# Patient Record
Sex: Female | Born: 1980 | Race: White | Hispanic: No | Marital: Married | Smoking: Never smoker
Health system: Southern US, Community
[De-identification: ages and names within clinical notes are randomized; demographics above are authoritative.]

---

## 2002-06-25 ENCOUNTER — Other Ambulatory Visit: Admission: RE | Admit: 2002-06-25 | Discharge: 2002-06-25 | Payer: Self-pay | Admitting: Gynecology

## 2004-02-15 ENCOUNTER — Other Ambulatory Visit: Admission: RE | Admit: 2004-02-15 | Discharge: 2004-02-15 | Payer: Self-pay | Admitting: Gynecology

## 2005-02-25 ENCOUNTER — Other Ambulatory Visit: Admission: RE | Admit: 2005-02-25 | Discharge: 2005-02-25 | Payer: Self-pay | Admitting: Gynecology

## 2006-02-27 ENCOUNTER — Other Ambulatory Visit: Admission: RE | Admit: 2006-02-27 | Discharge: 2006-02-27 | Payer: Self-pay | Admitting: Gynecology

## 2007-02-02 ENCOUNTER — Ambulatory Visit (HOSPITAL_COMMUNITY): Admission: RE | Admit: 2007-02-02 | Discharge: 2007-02-02 | Payer: Self-pay | Admitting: Family Medicine

## 2010-05-08 ENCOUNTER — Inpatient Hospital Stay (HOSPITAL_COMMUNITY): Admission: RE | Admit: 2010-05-08 | Payer: Self-pay | Source: Ambulatory Visit | Admitting: Obstetrics and Gynecology

## 2010-05-08 ENCOUNTER — Inpatient Hospital Stay (HOSPITAL_COMMUNITY)
Admission: RE | Admit: 2010-05-08 | Discharge: 2010-05-11 | DRG: 373 | Disposition: A | Discharge status: Home / Self Care | Payer: BC Managed Care – PPO | Source: Ambulatory Visit | Attending: Obstetrics and Gynecology | Admitting: Obstetrics and Gynecology

## 2010-05-08 DIAGNOSIS — O99892 Other specified diseases and conditions complicating childbirth: Secondary | ICD-10-CM | POA: Diagnosis present

## 2010-05-08 DIAGNOSIS — Z22330 Carrier of Group B streptococcus: Secondary | ICD-10-CM

## 2010-05-08 LAB — CBC
HCT: 41.7 % (ref 36.0–46.0)
MCH: 31.5 pg (ref 26.0–34.0)
MCV: 93.1 fL (ref 78.0–100.0)
RBC: 4.48 MIL/uL (ref 3.87–5.11)
WBC: 8.6 10*3/uL (ref 4.0–10.5)

## 2010-05-14 NOTE — Discharge Summary (Signed)
  Debra Roberson, Debra Roberson           ACCOUNT NO.:  [id number]  MEDICAL RECORD NO.:  [id number]           PATIENT TYPE:  I  LOCATION:  9145                          FACILITY:  [facility]  PHYSICIAN:  Zenaida Niece, M.D.DATE OF BIRTH:  Sep 29, 1980  DATE OF ADMISSION:  05/08/2010 DATE OF DISCHARGE:  05/11/2010                              DISCHARGE SUMMARY   ADMISSION DIAGNOSIS:  Intrauterine pregnancy at 40 weeks and group B strep carrier.  DISCHARGE DIAGNOSES:  Intrauterine pregnancy at 40 weeks and group B strep carrier.  PROCEDURES:  On April 25, she had a spontaneous vaginal delivery.  HISTORY AND PHYSICAL:  This is a 30 year old gravida 1, para 0 with an EGA of 40 plus weeks who is admitted for elective induction.  Prenatal care uncomplicated.  PRENATAL LABS:  Blood type is A+ with a negative antibody screen, rubella immune, first trimester screen normal.  MSAFP normal.  GCT 159. GTT normal.  Group B strep is positive.  PAST HISTORY:  Tonsillectomy and wisdom tooth extraction.  ALLERGIES:  VICODIN which causes nausea, vomiting.  PHYSICAL EXAMINATION:  VITAL SIGNS:  She is afebrile with stable vital signs.  Fetal heart tracing category 1. ABDOMEN:  Gravid, nontender with an estimated fetal weight of 8 pounds. Cervix 1-2, 60, -2 vertex presentation.  HOSPITAL COURSE:  The patient was started on Pitocin for induction and penicillin for group B strep prophylaxis.  She progressed to 2 and 3 cm and membranes were ruptured.  She then gradually progressed in active labor, reached complete, pushed well, and early on the morning of April 25 had a vaginal delivery of a viable female infant with Apgar's of 6 and 8, weight 8 pounds 5 ounces and had a nuchal cord x1.  Placenta delivered spontaneous was intact.  She had an irregular second-degree laceration repaired with 2-0 and 3-0 Vicryl with local block.  Estimated blood loss was 500 mL.  She had no pain medication during  labor. Postpartum, she had no significant complications and on postpartum day #2 was stable for discharge home.  DISCHARGE INSTRUCTIONS:  Regular diet, pelvic rest.  Followup is in 6 weeks.  MEDICATIONS:  Percocet #20, 1-2 p.o. q.4-6 h. p.r.n. pain and over-the- counter ibuprofen as needed and she is given our discharge pamphlet.     Zenaida Niece, M.D.     TDM/MEDQ  D:  05/11/2010  T:  05/11/2010  Job:  [id number]  Electronically Signed by Lavina Hamman M.D. on 05/14/2010 08:37:44 AM

## 2014-12-26 ENCOUNTER — Other Ambulatory Visit: Payer: BC Managed Care – PPO

## 2014-12-26 ENCOUNTER — Other Ambulatory Visit: Payer: Self-pay | Admitting: Internal Medicine

## 2014-12-26 DIAGNOSIS — R1011 Right upper quadrant pain: Secondary | ICD-10-CM

## 2015-05-11 ENCOUNTER — Other Ambulatory Visit: Payer: Self-pay | Admitting: Family Medicine

## 2015-05-11 DIAGNOSIS — M898X8 Other specified disorders of bone, other site: Secondary | ICD-10-CM

## 2015-05-26 ENCOUNTER — Ambulatory Visit
Admission: RE | Admit: 2015-05-26 | Discharge: 2015-05-26 | Disposition: A | Discharge status: Home / Self Care | Payer: BC Managed Care – PPO | Source: Ambulatory Visit | Attending: Family Medicine | Admitting: Family Medicine

## 2015-05-26 DIAGNOSIS — M898X8 Other specified disorders of bone, other site: Secondary | ICD-10-CM

## 2016-02-26 ENCOUNTER — Other Ambulatory Visit: Payer: Self-pay | Admitting: Family Medicine

## 2016-02-26 DIAGNOSIS — R1013 Epigastric pain: Secondary | ICD-10-CM

## 2016-02-28 ENCOUNTER — Ambulatory Visit
Admission: RE | Admit: 2016-02-28 | Discharge: 2016-02-28 | Disposition: A | Discharge status: Home / Self Care | Payer: BC Managed Care – PPO | Source: Ambulatory Visit | Attending: Family Medicine | Admitting: Family Medicine

## 2016-02-28 DIAGNOSIS — R1013 Epigastric pain: Secondary | ICD-10-CM

## 2017-07-12 ENCOUNTER — Ambulatory Visit (INDEPENDENT_AMBULATORY_CARE_PROVIDER_SITE_OTHER): Payer: BC Managed Care – PPO

## 2017-07-12 ENCOUNTER — Ambulatory Visit: Payer: BC Managed Care – PPO | Admitting: Podiatry

## 2017-07-12 DIAGNOSIS — M84374A Stress fracture, right foot, initial encounter for fracture: Secondary | ICD-10-CM

## 2017-07-12 DIAGNOSIS — M779 Enthesopathy, unspecified: Secondary | ICD-10-CM | POA: Diagnosis not present

## 2017-07-12 MED ORDER — MELOXICAM 7.5 MG PO TABS: 7.5000 mg | ORAL_TABLET | Freq: Every day | ORAL | 0 refills | Status: AC

## 2017-07-12 NOTE — Progress Notes (Signed)
Subjective:   Patient ID: Debra Roberson, female   DOB: 37 y.o.   MRN: [id number]   HPI 37 year old female presents the office today for concerns of pain to the ball of her right foot.  She states that started a couple months ago more on top of her foot but over the last month has been getting worse and over the past week she has not been able to put weight on her foot without any sharp pain.  She has no recent treatment for it.  She has had some minimal swelling but no numbness or tingling to the toes.  She has no other concerns today.   Review of Systems  All other systems reviewed and are negative.  No past medical history on file.    No current outpatient medications on file.  Allergies not on file       Objective:  Physical Exam  General: AAO x3, NAD  Dermatological: Skin is warm, dry and supple bilateral. Nails x 10 are well manicured; remaining integument appears unremarkable at this time. There are no open sores, no preulcerative lesions, no rash or signs of infection present.  Vascular: Dorsalis Pedis artery and Posterior Tibial artery pedal pulses are 2/4 bilateral with immedate capillary fill time.  There is no pain with calf compression, swelling, warmth, erythema.   Neruologic: Grossly intact via light touch bilateral. Protective threshold with Semmes Wienstein monofilament intact to all pedal sites bilateral.   Musculoskeletal: There is tenderness directly along the second metatarsal distally is in the left third foot third metatarsal.  There is trace edema there is no erythema or increase in warmth.  Is no pain with MPJ.  There is no pain in the interspaces nodes no palpable neuroma identified.  No other areas of tenderness identified today.  Muscular strength 5/5 in all groups tested bilateral.  Gait: Unassisted, Nonantalgic.       Assessment:   Left second metatarsal stress fracture    Plan:  -Treatment options discussed including all alternatives,  risks, and complications -Etiology of symptoms were discussed -X-rays were obtained and reviewed with the patient.  The oblique view concern for radiolucency in the neck of the second metatarsal concerning for stress fracture.  Also given this is where she has majority tenderness when immobilized in a cam boot.  Prescribed meloxicam to take as needed.  Continue ice elevation.  RTC 2 weeks or sooner if needed.  X-ray next appointment  Vivi BarrackMatthew R Wagoner DPM

## 2017-07-25 ENCOUNTER — Ambulatory Visit (INDEPENDENT_AMBULATORY_CARE_PROVIDER_SITE_OTHER): Payer: BC Managed Care – PPO

## 2017-07-25 ENCOUNTER — Ambulatory Visit: Payer: BC Managed Care – PPO | Admitting: Podiatry

## 2017-07-25 DIAGNOSIS — M84374A Stress fracture, right foot, initial encounter for fracture: Secondary | ICD-10-CM

## 2017-07-25 DIAGNOSIS — M84374D Stress fracture, right foot, subsequent encounter for fracture with routine healing: Secondary | ICD-10-CM | POA: Diagnosis not present

## 2017-07-28 NOTE — Progress Notes (Signed)
Subjective: 37 year old female presents the office today for follow-up evaluation of a stress fracture to the right foot.  She states that since wearing the cam boot her pain has improved but she has not gone without it.  She has started to notice some tingling to the toe the last couple days but denies any recent injury or increase in swelling.  She has no other new concerns. Denies any systemic complaints such as fevers, chills, nausea, vomiting. No acute changes since last appointment, and no other complaints at this time.   Objective: AAO x3, NAD DP/PT pulses palpable bilaterally, CRT less than 3 seconds At this time there is significantly improved tenderness on the second metatarsal and there is no pain to vibratory sensation today.  There is no significant swelling there is no erythema or increase in warmth.  There is no other areas of pinpoint tenderness.  No open lesions or pre-ulcerative lesions.  No pain with calf compression, swelling, warmth, erythema  Assessment: Left foot stress fracture with improvement  Plan: -All treatment options discussed with the patient including all alternatives, risks, complications.  -Repeat x-rays did reveal reveal some fine in the neck of the second metatarsal most seen on the oblique view.  She had most of her tenderness previously and this is improving.  I want her to continue the cam boot for the next 2 weeks initially can slowly start to transition out of the boot as she is able to.  Continue ice elevate and limit activity. -Patient encouraged to call the office with any questions, concerns, change in symptoms.   RTC 2-3 weeks or sooner if needed. X-ray right foot next appointment  Vivi BarrackMatthew R Wagoner DPM

## 2017-08-14 ENCOUNTER — Ambulatory Visit: Payer: BC Managed Care – PPO | Admitting: Podiatry

## 2017-08-14 ENCOUNTER — Encounter: Payer: Self-pay | Admitting: Podiatry

## 2017-08-14 ENCOUNTER — Ambulatory Visit (INDEPENDENT_AMBULATORY_CARE_PROVIDER_SITE_OTHER): Payer: BC Managed Care – PPO

## 2017-08-14 DIAGNOSIS — M84374D Stress fracture, right foot, subsequent encounter for fracture with routine healing: Secondary | ICD-10-CM | POA: Diagnosis not present

## 2017-08-14 DIAGNOSIS — M779 Enthesopathy, unspecified: Secondary | ICD-10-CM

## 2017-08-14 MED ORDER — MELOXICAM 15 MG PO TABS: 15.0000 mg | ORAL_TABLET | Freq: Every day | ORAL | 0 refills | Status: DC

## 2017-08-18 NOTE — Progress Notes (Signed)
Subjective: 37 year old female presents the office today for follow-up evaluation of a stress fracture to the right foot.  Overall she states that she is doing better.  She is wearing a regular shoe.  She states that she is still nervous but full weight on her foot.  She denies any recent injury or trauma since I last saw her.  She has no other changes.  Objective: AAO x3, NAD-presents today wearing [place] top ciders. DP/PT pulses palpable bilaterally, CRT less than 3 seconds This time is only tenderness palpation mostly to the plantar aspect of the right foot on the submetatarsal 2 area.  There is minimal edema plantarly compared to the contralateral extremity.  Overall the tenderness has improved.  There is no area pinpoint tenderness identified today. No open lesions or pre-ulcerative lesions.  No pain with calf compression, swelling, warmth, erythema  Assessment: Left foot stress fracture with improvement; capsulitis  Plan: -All treatment options discussed with the patient including all alternatives, risks, complications.  -Repeat x-rays were performed today.  There is some swelling present to the second metatarsal head but there is no definitive evidence of acute fracture identified. -Prescribed meloxicam discussed side effects.  Offloading pads were dispensed.  If symptoms continue or do a steroid injection into the area for now we will hold off because she is still improving.  Follow-up as scheduled if symptoms continue or sooner if there is any issues that arise.  She agrees with this plan.  Vivi BarrackMatthew R Wagoner DPM

## 2017-09-10 ENCOUNTER — Other Ambulatory Visit: Payer: Self-pay | Admitting: Podiatry

## 2017-10-14 IMAGING — CT CT HEAD W/O CM
3 of 4 series · 16 of 47 positions shown, 19 images · non-contrast
Comparison: None.

CLINICAL DATA: Area of prominence along left-sided skull superiorly

EXAM:
CT HEAD WITHOUT CONTRAST
TECHNIQUE: Contiguous axial images were obtained from the base of the skull
through the vertex without intravenous contrast.

[Series 32: 3d filtered head w/o · axial · non-contrast · 0.49mm/px · z∈[-18,+127]mm · 10 of 35 slices shown, 13 images]
[im 3/35  brain]
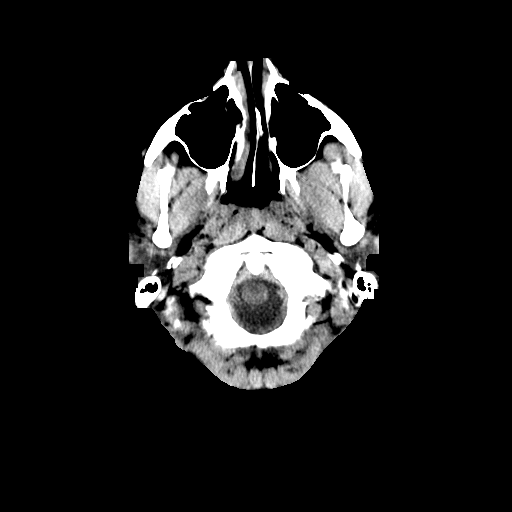
[im 3/35  bone]
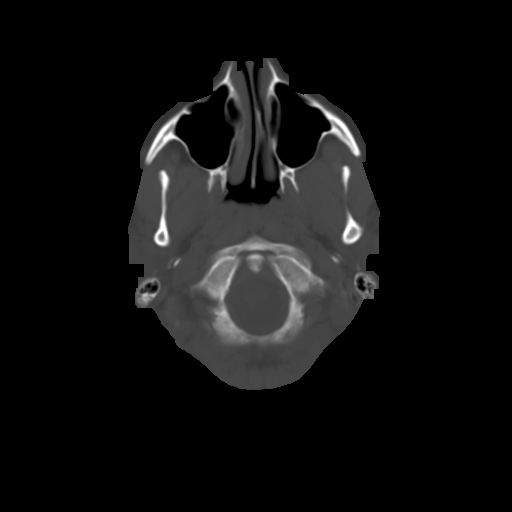
[im 5/35  brain]
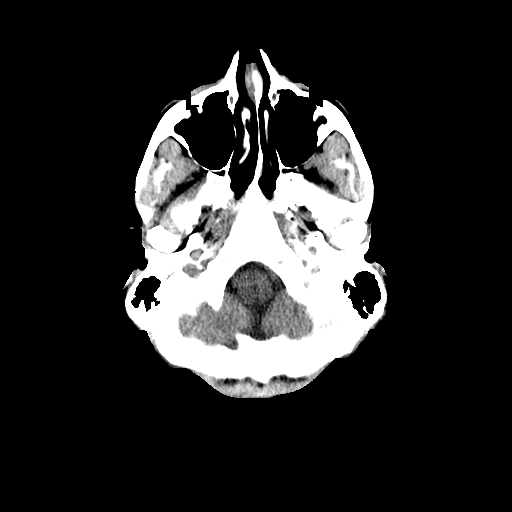
[im 10/35  brain]
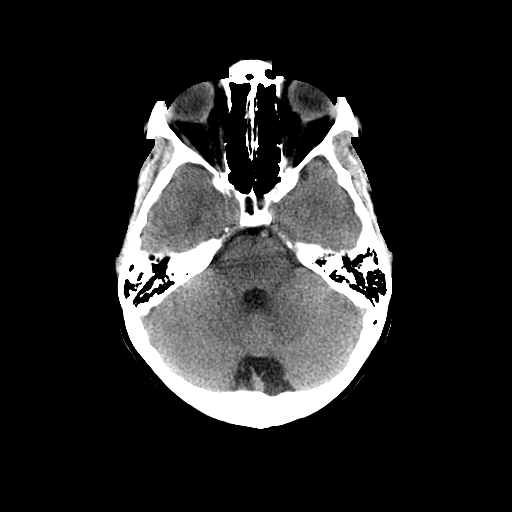
[im 13/35  brain]
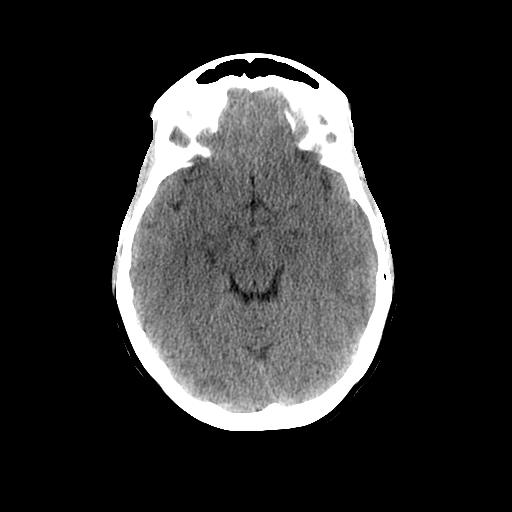
[im 15/35  brain]
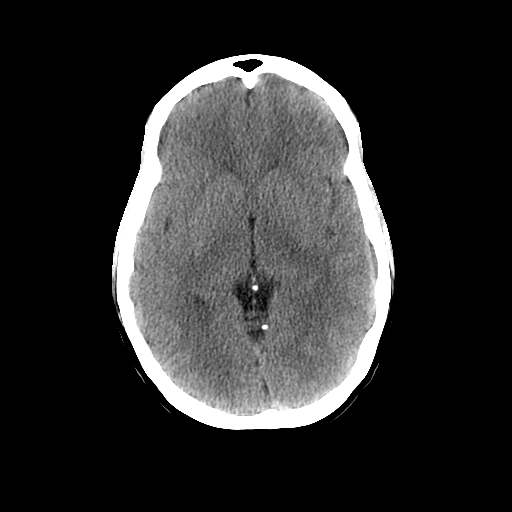
[im 15/35  bone]
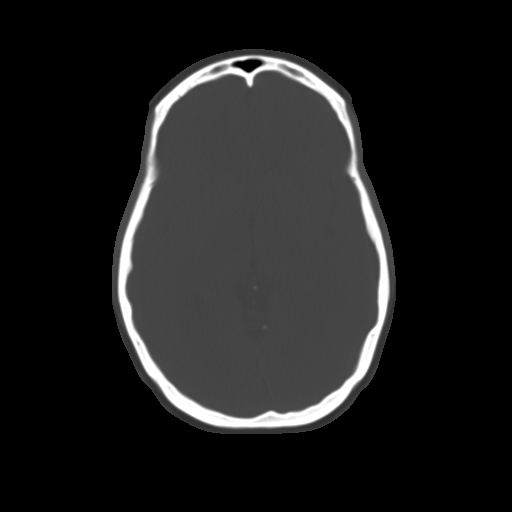
[im 20/35  brain]
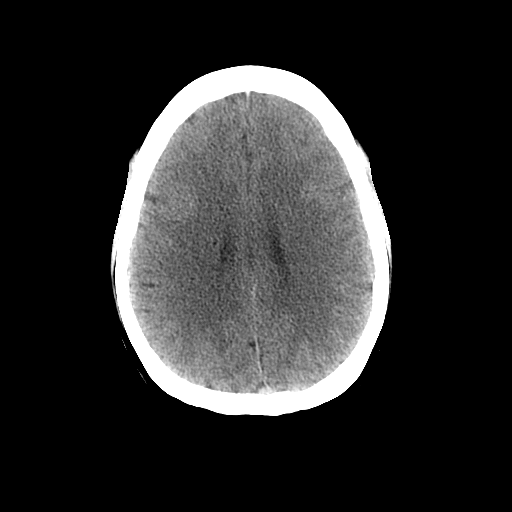
[im 22/35  brain]
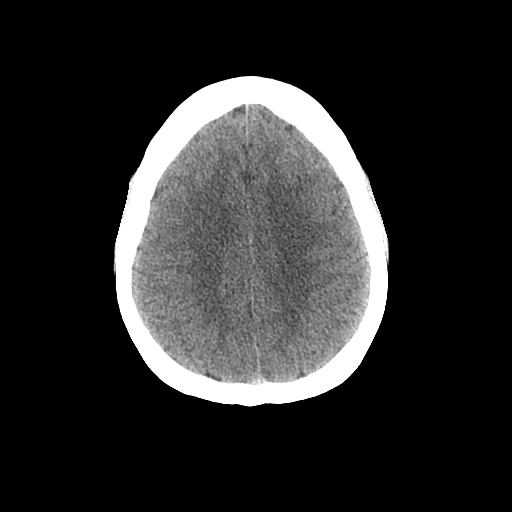
[im 25/35  brain]
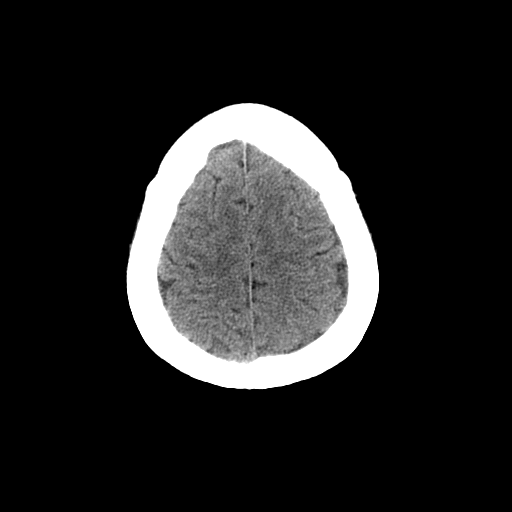
[im 30/35  brain]
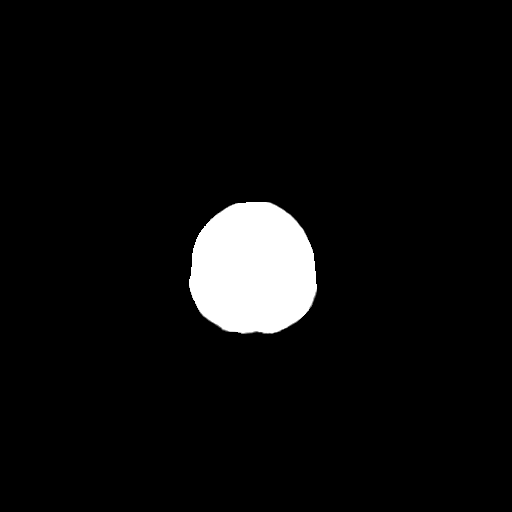
[im 30/35  bone]
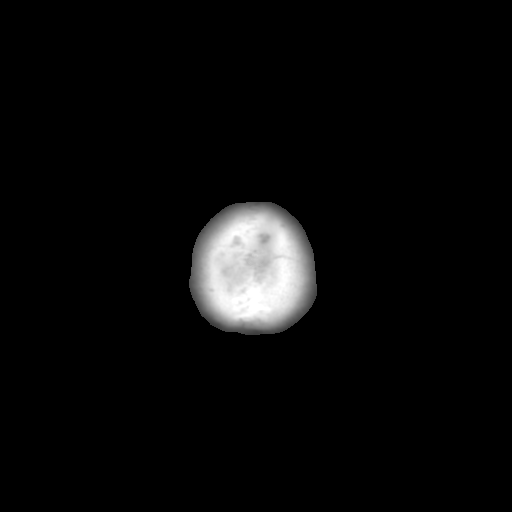
[im 32/35  brain]
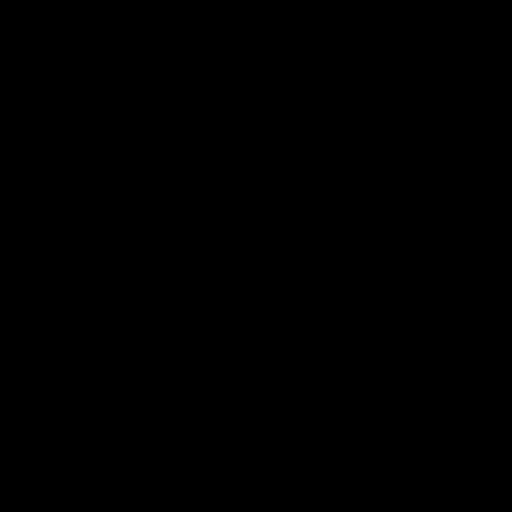

[Series 601: coronal brain · coronal · 0.49mm/px · 3 of 72 slices shown]
[im 24/72  brain]
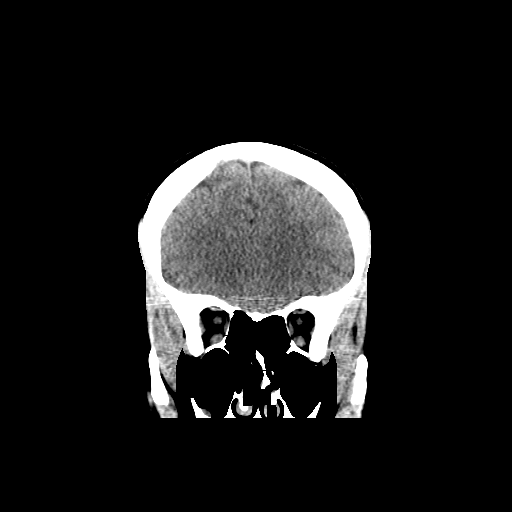
[im 32/72  brain]
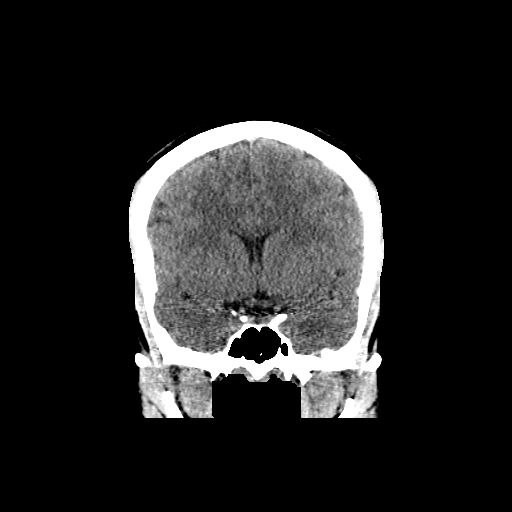
[im 40/72  brain]
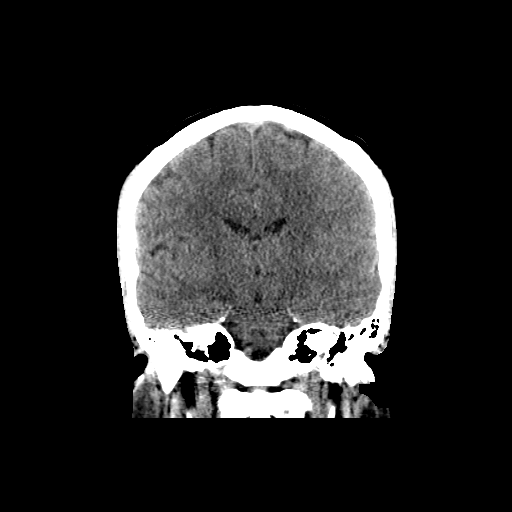

[Series 602: sagittal brain · sagittal · 0.49mm/px · 3 of 59 slices shown]
[im 20/59  brain]
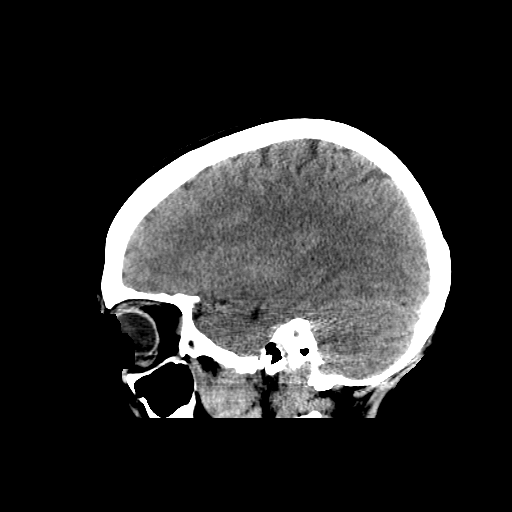
[im 30/59  brain]
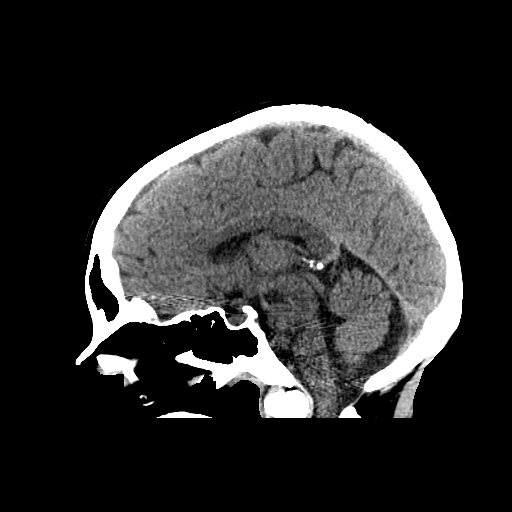
[im 39/59  brain]
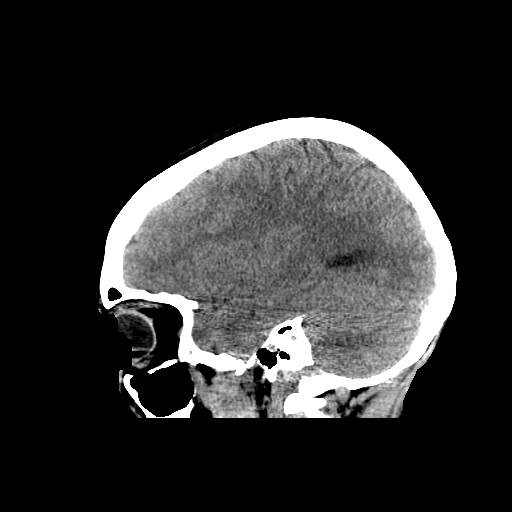

[16 of 47 positions shown; findings below may reference images not displayed]

FINDINGS: A marker was placed over the area of clinical concern. There is a
benign-appearing exostosis near the left frontal -parietal junction
of the skull near the vertex. This benign-appearing exostosis
measures 2.0 x 0.9 x 0.6 cm. There is no associated erosion or bony
destruction. The overlying scalp appears normal in this area. Bony
calvarium otherwise appears normal. Mastoid air cells clear.

Heart the ventricles are normal in size and configuration. There is
prominence of the cisterna magna, an anatomic variant. There is no
demonstrable mass, hemorrhage, extra-axial fluid collection, or
midline shift. The gray-white compartments are normal. No evidence
of acute infarct. Orbits appear symmetric bilaterally.
IMPRESSION: At the site of clinical concern on the left at the frontal-parietal
junction near the vertex, there is a benign appearing exostosis
arising from the outer table of the skull. No other bone lesion
identified.

There is no intracranial mass, hemorrhage, extra-axial fluid
collection. Gray-white compartments appear normal. Prominence of the
cisterna magna is an anatomic variant.

## 2018-07-19 IMAGING — CT CT ABDOMEN W/O CM
1 of 2 series · 16 of 32 positions shown, 20 images · non-contrast
Comparison: None.

CLINICAL DATA: Epigastric pain since the age of 11. Recent
worsening.

EXAM:
CT ABDOMEN WITHOUT CONTRAST
TECHNIQUE: Multidetector CT imaging of the abdomen was performed following the
standard protocol without IV contrast.

[Series 2: abd w/(date) · axial · 0.68mm/px · z∈[-331,-66]mm · 16 of 59 slices shown, 20 images]
[im 3/59  soft-tissue]
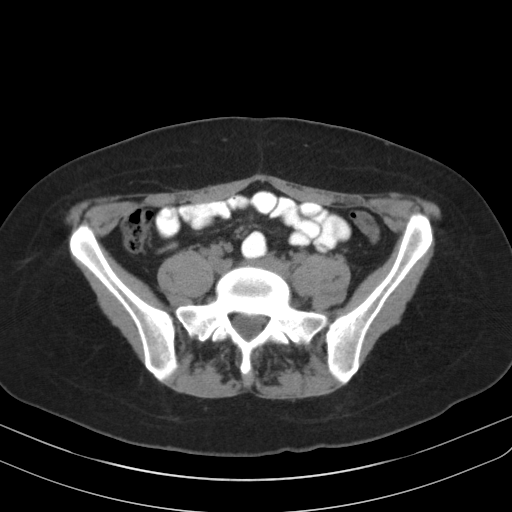
[im 3/59  bone]
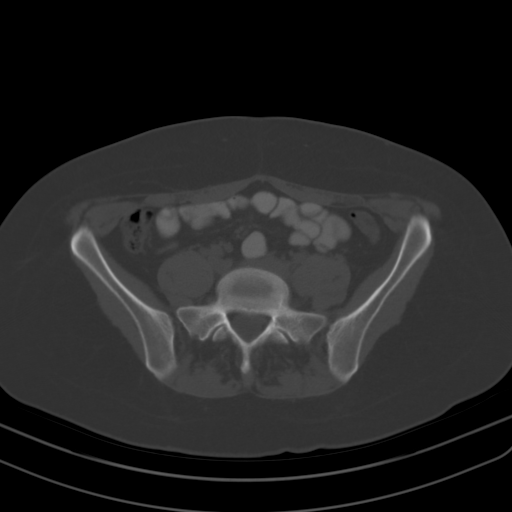
[im 8/59  soft-tissue]
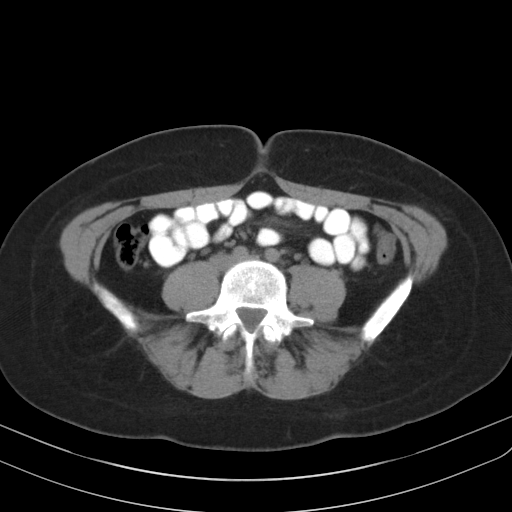
[im 13/59  soft-tissue]
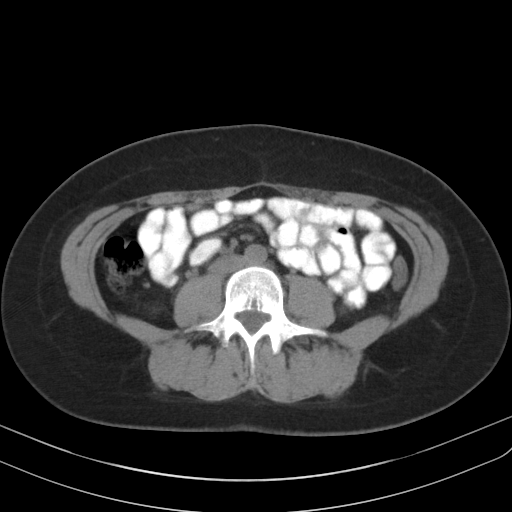
[im 15/59  soft-tissue]
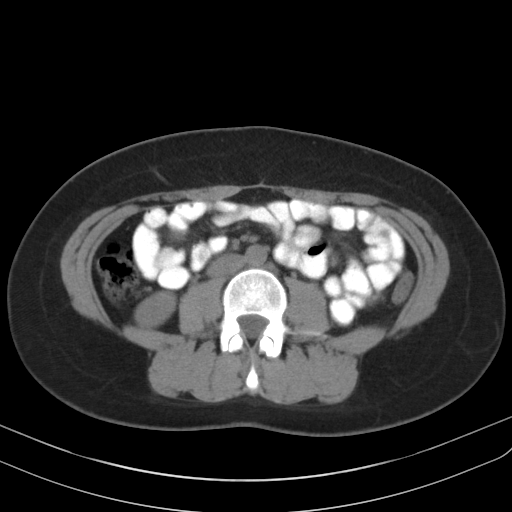
[im 20/59  soft-tissue]
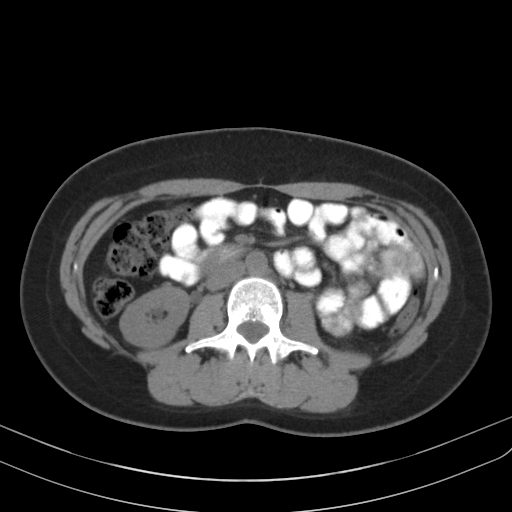
[im 25/59  soft-tissue]
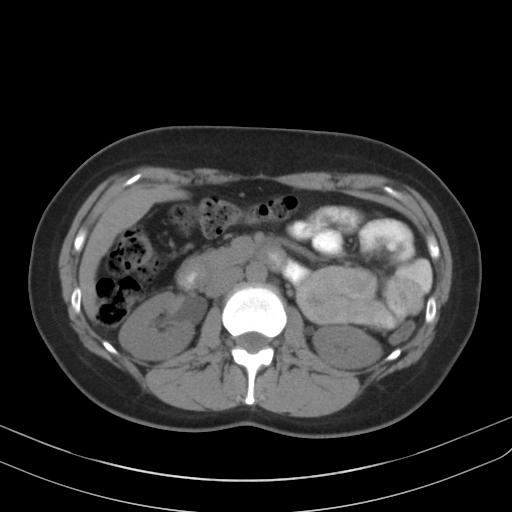
[im 27/59  soft-tissue]
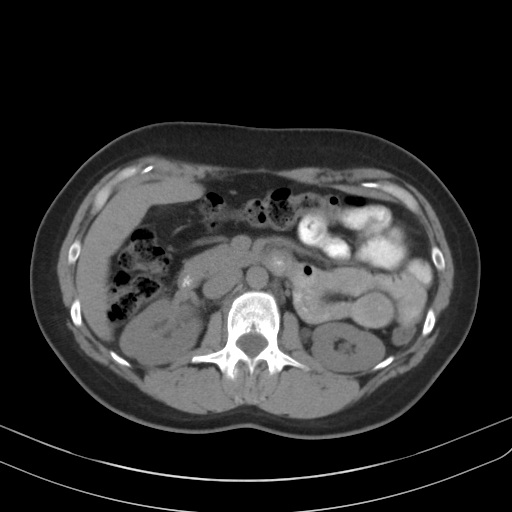
[im 32/59  soft-tissue]
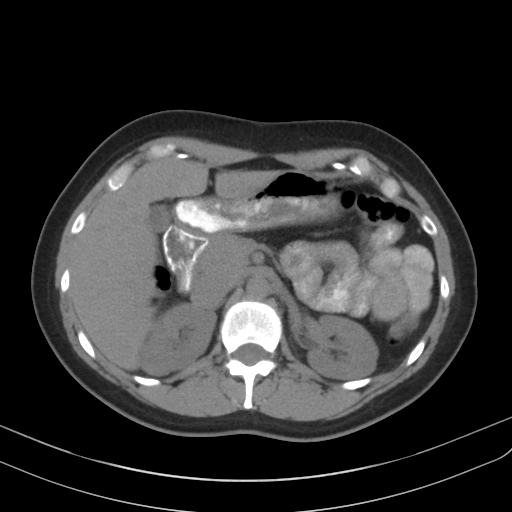
[im 34/59  soft-tissue]
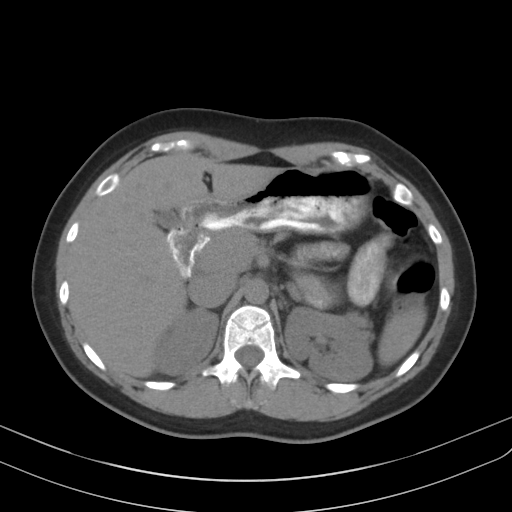
[im 34/59  bone]
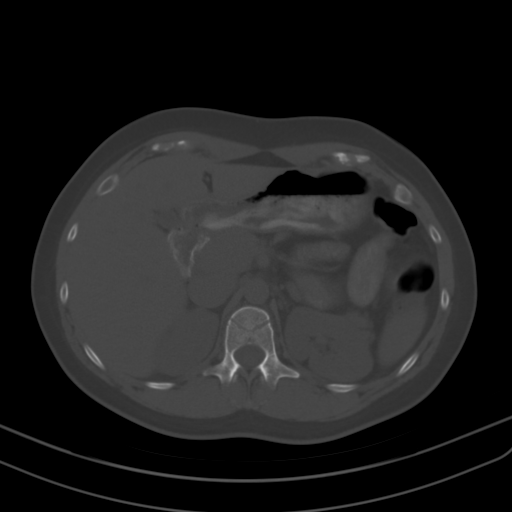
[im 39/59  soft-tissue]
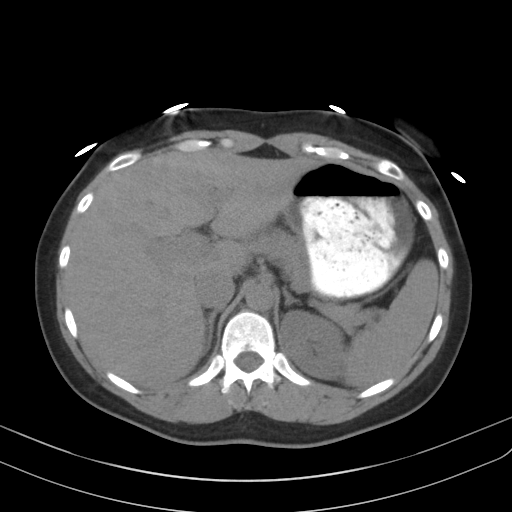
[im 44/59  soft-tissue]
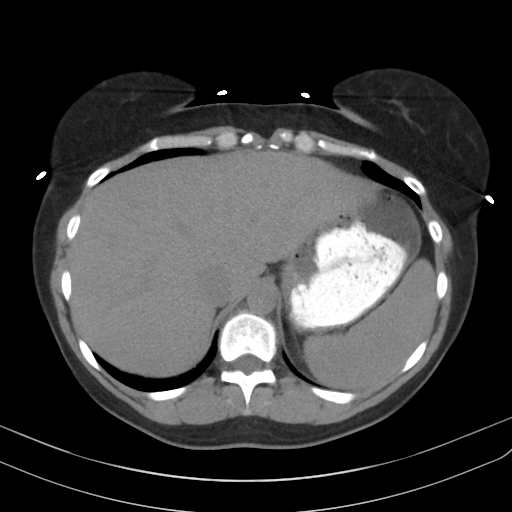
[im 46/59  soft-tissue]
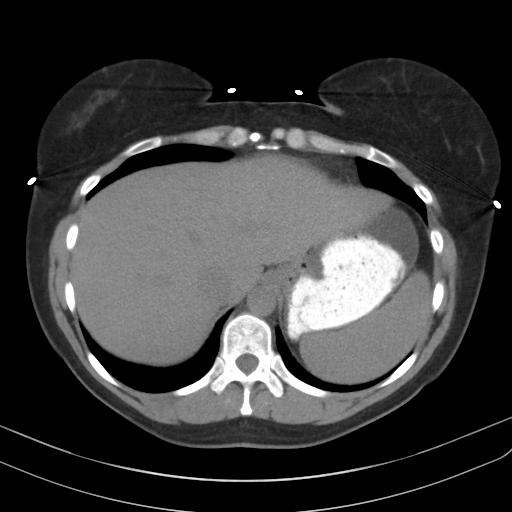
[im 49/59  lung]
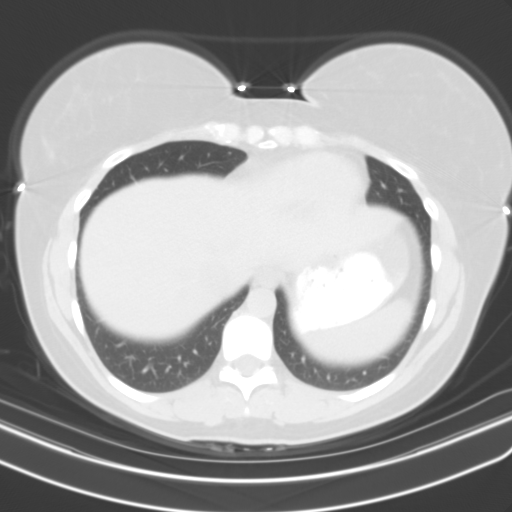
[im 51/59  soft-tissue]
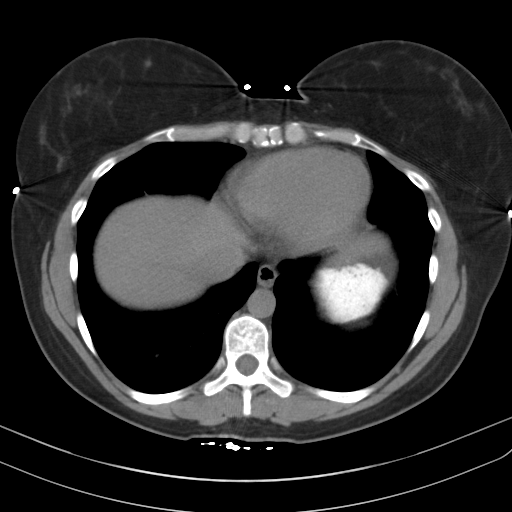
[im 51/59  lung]
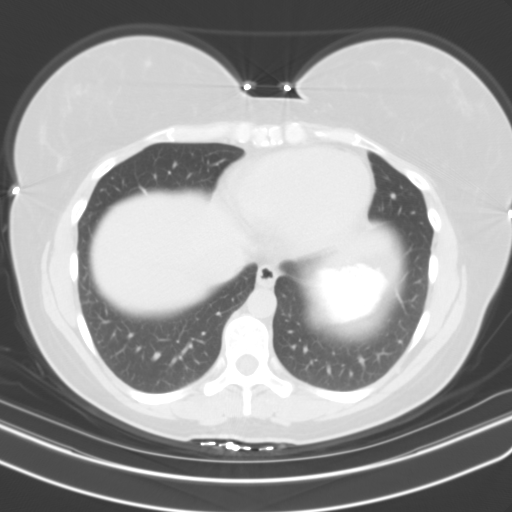
[im 54/59  lung]
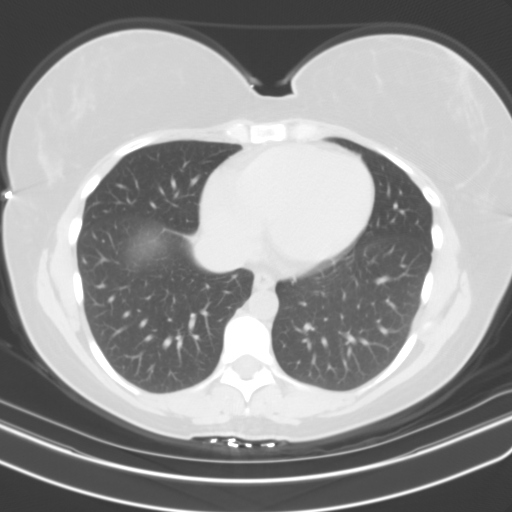
[im 56/59  soft-tissue]
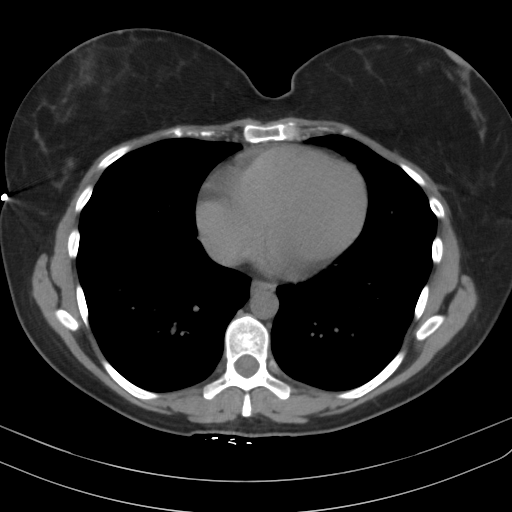
[im 56/59  lung]
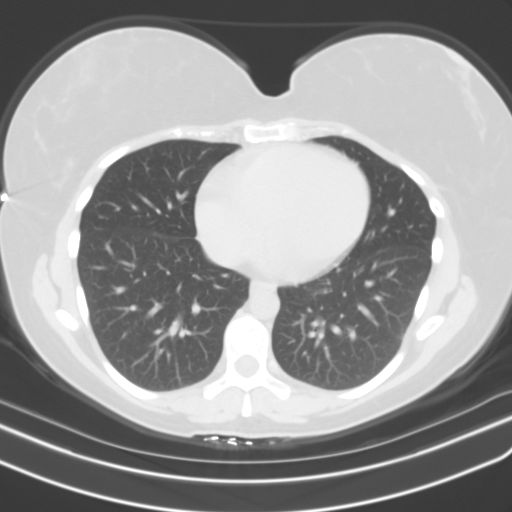

[16 of 32 positions shown; findings below may reference images not displayed]

FINDINGS: Lower chest: Normal

Hepatobiliary: Liver is normal.  No calcified gallstones.

Pancreas: Normal

Spleen: Normal

Adrenals/Urinary Tract: Adrenal glands are normal. Kidneys are
normal. No cyst, mass, stone or hydronephrosis.

Stomach/Bowel: Normal in the abdominal region.  Pelvis not included.

Vascular/Lymphatic: Normal

Other: No free fluid or air

Musculoskeletal: Normal
IMPRESSION: Normal CT.  No cause of the presenting symptoms is identified.

## 2019-09-02 ENCOUNTER — Other Ambulatory Visit: Payer: BC Managed Care – PPO

## 2019-09-02 ENCOUNTER — Other Ambulatory Visit: Payer: Self-pay

## 2019-09-02 DIAGNOSIS — Z20822 Contact with and (suspected) exposure to covid-19: Secondary | ICD-10-CM

## 2019-09-03 LAB — SARS-COV-2, NAA 2 DAY TAT

## 2019-09-03 LAB — NOVEL CORONAVIRUS, NAA: SARS-CoV-2, NAA: NOT DETECTED

## 2022-08-02 ENCOUNTER — Other Ambulatory Visit: Payer: Self-pay | Admitting: Obstetrics and Gynecology

## 2022-08-02 DIAGNOSIS — R928 Other abnormal and inconclusive findings on diagnostic imaging of breast: Secondary | ICD-10-CM

## 2022-08-19 ENCOUNTER — Ambulatory Visit
Admission: RE | Admit: 2022-08-19 | Discharge: 2022-08-19 | Disposition: A | Discharge status: Home / Self Care | Payer: BC Managed Care – PPO | Source: Ambulatory Visit | Attending: Obstetrics and Gynecology | Admitting: Obstetrics and Gynecology

## 2022-08-19 ENCOUNTER — Other Ambulatory Visit: Payer: Self-pay | Admitting: Obstetrics and Gynecology

## 2022-08-19 DIAGNOSIS — R928 Other abnormal and inconclusive findings on diagnostic imaging of breast: Secondary | ICD-10-CM

## 2022-08-19 DIAGNOSIS — N6310 Unspecified lump in the right breast, unspecified quadrant: Secondary | ICD-10-CM

## 2023-02-20 ENCOUNTER — Other Ambulatory Visit: Payer: Self-pay | Admitting: Obstetrics and Gynecology

## 2023-02-20 ENCOUNTER — Ambulatory Visit
Admission: RE | Admit: 2023-02-20 | Discharge: 2023-02-20 | Disposition: A | Discharge status: Home / Self Care | Payer: 59 | Source: Ambulatory Visit | Attending: Obstetrics and Gynecology | Admitting: Obstetrics and Gynecology

## 2023-02-20 DIAGNOSIS — N6310 Unspecified lump in the right breast, unspecified quadrant: Secondary | ICD-10-CM

## 2023-08-22 ENCOUNTER — Ambulatory Visit
Admission: RE | Admit: 2023-08-22 | Discharge: 2023-08-22 | Disposition: A | Discharge status: Home / Self Care | Source: Ambulatory Visit | Attending: Obstetrics and Gynecology | Admitting: Obstetrics and Gynecology

## 2023-08-22 DIAGNOSIS — N6310 Unspecified lump in the right breast, unspecified quadrant: Secondary | ICD-10-CM

## 2023-09-27 ENCOUNTER — Encounter (HOSPITAL_COMMUNITY): Payer: Self-pay | Admitting: *Deleted

## 2023-09-27 ENCOUNTER — Emergency Department (HOSPITAL_COMMUNITY)

## 2023-09-27 ENCOUNTER — Other Ambulatory Visit: Payer: Self-pay

## 2023-09-27 ENCOUNTER — Emergency Department (HOSPITAL_COMMUNITY)
Admission: EM | Admit: 2023-09-27 | Discharge: 2023-09-27 | Disposition: A | Discharge status: Home / Self Care | Attending: Emergency Medicine | Admitting: Emergency Medicine

## 2023-09-27 DIAGNOSIS — R202 Paresthesia of skin: Secondary | ICD-10-CM | POA: Insufficient documentation

## 2023-09-27 DIAGNOSIS — R519 Headache, unspecified: Secondary | ICD-10-CM | POA: Insufficient documentation

## 2023-09-27 LAB — BASIC METABOLIC PANEL WITH GFR
Anion gap: 8 (ref 5–15)
BUN: 10 mg/dL (ref 6–20)
CO2: 22 mmol/L (ref 22–32)
Calcium: 9 mg/dL (ref 8.9–10.3)
Chloride: 104 mmol/L (ref 98–111)
Creatinine, Ser: 0.82 mg/dL (ref 0.44–1.00)
GFR, Estimated: >60 mL/min (ref >60)
Glucose, Bld: 117 mg/dL — ABNORMAL HIGH (ref 70–99)
Potassium: 4.2 mmol/L (ref 3.5–5.1)
Sodium: 134 mmol/L — ABNORMAL LOW (ref 135–145)

## 2023-09-27 LAB — D-DIMER, QUANTITATIVE: D-Dimer, Quant: <0.27 ug{FEU}/mL (ref 0.00–0.50)

## 2023-09-27 LAB — CBC
HCT: 43.9 % (ref 36.0–46.0)
Hemoglobin: 14.3 g/dL (ref 12.0–15.0)
MCH: 29.8 pg (ref 26.0–34.0)
MCHC: 32.6 g/dL (ref 30.0–36.0)
MCV: 91.5 fL (ref 80.0–100.0)
Platelets: 225 K/uL (ref 150–400)
RBC: 4.8 MIL/uL (ref 3.87–5.11)
RDW: 12.5 % (ref 11.5–15.5)
WBC: 8.5 K/uL (ref 4.0–10.5)
nRBC: 0 % (ref 0.0–0.2)

## 2023-09-27 LAB — TROPONIN I (HIGH SENSITIVITY)
Troponin I (High Sensitivity): 2 ng/L (ref <18)
Troponin I (High Sensitivity): 4 ng/L (ref <18)

## 2023-09-27 LAB — HCG, SERUM, QUALITATIVE: Preg, Serum: NEGATIVE

## 2023-09-27 MED ORDER — DEXAMETHASONE SODIUM PHOSPHATE 10 MG/ML IJ SOLN
10.0000 mg | Freq: Once | INTRAMUSCULAR | Status: AC
  Administered 2023-09-27: 10 mg via INTRAVENOUS
  Filled 2023-09-27: qty 1

## 2023-09-27 MED ORDER — IOHEXOL 350 MG/ML SOLN
75.0000 mL | Freq: Once | INTRAVENOUS | Status: AC | PRN
  Administered 2023-09-27: 75 mL via INTRAVENOUS

## 2023-09-27 MED ORDER — SODIUM CHLORIDE 0.9 % IV BOLUS
1000.0000 mL | Freq: Once | INTRAVENOUS | Status: AC
  Administered 2023-09-27: 1000 mL via INTRAVENOUS

## 2023-09-27 MED ORDER — DIPHENHYDRAMINE HCL 50 MG/ML IJ SOLN
12.5000 mg | Freq: Once | INTRAMUSCULAR | Status: AC
  Administered 2023-09-27: 12.5 mg via INTRAVENOUS
  Filled 2023-09-27: qty 1

## 2023-09-27 MED ORDER — PROCHLORPERAZINE EDISYLATE 10 MG/2ML IJ SOLN
10.0000 mg | Freq: Once | INTRAMUSCULAR | Status: AC
  Administered 2023-09-27: 10 mg via INTRAVENOUS
  Filled 2023-09-27: qty 2

## 2023-09-27 MED ORDER — GADOBUTROL 1 MMOL/ML IV SOLN
9.0000 mL | Freq: Once | INTRAVENOUS | Status: AC | PRN
  Administered 2023-09-27: 9 mL via INTRAVENOUS

## 2023-09-27 NOTE — ED Triage Notes (Signed)
 BIB family from home for L jaw tightness, concern for heart attack, and associated L side numbness/ tingling. Denies CP/ chest discomfort. Took ASA 324mg  PTA. Alert, NAD, calm, interactive, steady gait, speaking in clear complete sentences. Pt and family anxious.

## 2023-09-27 NOTE — ED Provider Notes (Signed)
 [place] [facility] DEPARTMENT AT [facility] Provider Note   CSN: [id number] Arrival date & time: 09/27/23  1502    Patient presents with: Jaw Pain   Debra Roberson is a 43 y.o. female left face pressure, different sensation to left jaw.  Tingling left arm, left leg, no weakness, no slurred speech.  Has chronic headache.  No sudden onset thunderclap headache.  Has known issue with right optic nerve, followed by ophthalmology, patient has never been referred to neurology.  Unsure demyelinating process.  No chest pain, shortness of breath, vision changes, neck stiffness, fever, abdominal pain, incontinence.  MRI brain few years ago for migraine   HPI     Prior to Admission medications   Medication Sig Start Date End Date Taking? Authorizing Provider  cetirizine (ZYRTEC ALLERGY) 10 MG tablet Zyrtec    [provider]  meloxicam  (MOBIC ) 15 MG tablet TAKE 1 TABLET BY MOUTH EVERY DAY 09/11/17   Gershon Donnice SAUNDERS, DPM  meloxicam  (MOBIC ) 7.5 MG tablet Take 1 tablet (7.5 mg total) by mouth daily. 07/12/17   Gershon Donnice SAUNDERS, DPM  norethindrone (CAMILA) 0.35 MG tablet Camila 0.35 mg tablet    [provider]  VELIVET 0.1/0.125/0.15 -0.025 MG tablet Take 1 tablet by mouth daily. 06/11/17   [provider]    Allergies: Hydrocodone-acetaminophen    Review of Systems  Constitutional: Negative.   HENT: Negative.    Respiratory: Negative.    Cardiovascular: Negative.   Gastrointestinal: Negative.   Genitourinary: Negative.   Musculoskeletal: Negative.   Skin: Negative.   Neurological:  Positive for numbness and headaches. Negative for dizziness, tremors, seizures, facial asymmetry, speech difficulty, weakness and light-headedness.  All other systems reviewed and are negative.   Updated Vital Signs BP 122/78   Pulse 92   Temp 99.1 F (37.3 C) (Oral)   Resp 18   Ht 5' 8 (1.727 m)   Wt 86.2 kg   SpO2 100%   BMI 28.89 kg/m   Physical  Exam Physical Exam  Constitutional: Pt is oriented to person, place, and time. Pt appears well-developed and well-nourished. No distress.  HENT:  Head: Normocephalic and atraumatic.  Mouth/Throat: Oropharynx is clear and moist.  Eyes: Conjunctivae and EOM are normal. Pupils are equal, round, and reactive to light. No scleral icterus.  No horizontal, vertical or rotational nystagmus  Neck: Normal range of motion. Neck supple.  Full active and passive ROM without pain No midline or paraspinal tenderness No nuchal rigidity or meningeal signs  Cardiovascular: Normal rate, regular rhythm and intact distal pulses.   Pulmonary/Chest: Effort normal and breath sounds normal. No respiratory distress. Pt has no wheezes. No rales.  Abdominal: Soft. Bowel sounds are normal. There is no tenderness. There is no rebound and no guarding.  Musculoskeletal: Normal range of motion.  Lymphadenopathy:    No cervical adenopathy.  Neurological: Pt. is alert and oriented to person, place, and time. He has normal reflexes. No cranial nerve deficit.  Exhibits normal muscle tone. Coordination normal.  Mental Status:  Alert, oriented, thought content appropriate. Speech fluent without evidence of aphasia. Able to follow 2 step commands without difficulty.  Cranial Nerves:  2-12 grossly intact Motor:  Equal strength bil Sensory: sensory intact Cerebellar: normal finger-to-nose with bilateral upper extremities Gait: normal gait and balance CV: distal pulses palpable throughout   Skin: Skin is warm and dry. No rash noted. Pt is not diaphoretic.  Psychiatric: Pt has a normal mood and affect. Behavior is  normal. Judgment and thought content normal.  Nursing note and vitals reviewed.  (all labs ordered are listed, but only abnormal results are displayed) Labs Reviewed  BASIC METABOLIC PANEL WITH GFR - Abnormal; Notable for the following components:      Result Value   Sodium 134 (*)    Glucose, Bld 117 (*)    All  other components within normal limits  CBC  HCG, SERUM, QUALITATIVE  D-DIMER, QUANTITATIVE  TROPONIN I (HIGH SENSITIVITY)  TROPONIN I (HIGH SENSITIVITY)    EKG: None  Radiology: MR Cervical Spine W and Wo Contrast Result Date: 09/27/2023 CLINICAL DATA:  Initial evaluation for acute cervical radiculopathy. EXAM: MRI CERVICAL SPINE WITHOUT AND WITH CONTRAST TECHNIQUE: Multiplanar and multiecho pulse sequences of the cervical spine, to include the craniocervical junction and cervicothoracic junction, were obtained without and with intravenous contrast. CONTRAST:  9mL GADAVIST  GADOBUTROL  1 MMOL/ML IV SOLN COMPARISON:  None Available. FINDINGS: Alignment: Straightening of the normal cervical lordosis. Trace anterolisthesis of C4 on C5. Vertebrae: Vertebral body height maintained without acute or chronic fracture. Bone marrow signal intensity within normal limits. Few small benign hemangiomata noted. No worrisome osseous lesions. No abnormal marrow edema or enhancement. Cord: Normal signal and morphology.  No abnormal enhancement. Posterior Fossa, vertebral arteries, paraspinal tissues: Unremarkable. Disc levels: No significant disc pathology seen within the cervical spine for age. No significant disc bulge or focal disc herniation. No significant facet disease. No canal or neural foraminal stenosis or evidence for neural impingement. IMPRESSION: Normal MRI of the cervical spine and spinal cord. No significant disc pathology, stenosis, or evidence for neural impingement. Electronically Signed   By: Morene Hoard M.D.   On: 09/27/2023 21:19   MR Brain W and Wo Contrast Result Date: 09/27/2023 CLINICAL DATA:  Initial evaluation for acute neuro deficit, stroke suspected. EXAM: MRI HEAD AND ORBITS WITHOUT AND WITH CONTRAST TECHNIQUE: Multiplanar, multiecho pulse sequences of the brain and surrounding structures were obtained without and with intravenous contrast. Multiplanar, multiecho pulse sequences  of the orbits and surrounding structures were obtained including fat saturation techniques, before and after intravenous contrast administration. CONTRAST:  9mL GADAVIST  GADOBUTROL  1 MMOL/ML IV SOLN COMPARISON:  CT from earlier the same day. FINDINGS: MRI HEAD FINDINGS Brain: Cerebral volume within normal limits. No focal parenchymal signal abnormality. No abnormal foci of restricted diffusion to suggest acute or subacute ischemia. No areas of chronic cortical infarction. No acute or chronic intracranial blood products. No mass lesion, midline shift or mass effect. Previously question small meningioma at the left middle cranial fossa likely reflected a small cortical vein. Ventricles normal size without hydrocephalus. No extra-axial fluid collection. Pituitary gland and suprasellar region within normal limits. No abnormal enhancement. Vascular: Major intracranial vascular flow voids are maintained. Skull and upper cervical spine: Craniocervical junction within normal limits. Bone marrow signal intensity within normal limits. No scalp soft tissue abnormality. Other: Mastoid air cells are largely clear. MRI ORBITS FINDINGS Orbits: Globes are symmetric in size with normal appearance and morphology bilaterally. Optic nerves symmetric and within normal limits. No abnormal optic nerve edema or enhancement to suggest acute optic neuritis. No abnormality about either optic nerve sheath complex. Intraconal and extraconal fat maintained. Extra-ocular muscles symmetric and within normal limits. Lacrimal glands normal. No abnormality about the orbital apices or cavernous sinus. Optic chiasm normally situated within the suprasellar cistern. Visualized sinuses: Minimal mucosal thickening about the ethmoidal air cells and right maxillary sinus. Otherwise clear. Soft tissues: Unremarkable. IMPRESSION: Normal MRI of the brain  and orbits. No acute intracranial abnormality. No evidence for acute optic neuritis. Electronically Signed    By: Morene Hoard M.D.   On: 09/27/2023 21:15   MR ORBITS W WO CONTRAST Result Date: 09/27/2023 CLINICAL DATA:  Initial evaluation for acute neuro deficit, stroke suspected. EXAM: MRI HEAD AND ORBITS WITHOUT AND WITH CONTRAST TECHNIQUE: Multiplanar, multiecho pulse sequences of the brain and surrounding structures were obtained without and with intravenous contrast. Multiplanar, multiecho pulse sequences of the orbits and surrounding structures were obtained including fat saturation techniques, before and after intravenous contrast administration. CONTRAST:  9mL GADAVIST  GADOBUTROL  1 MMOL/ML IV SOLN COMPARISON:  CT from earlier the same day. FINDINGS: MRI HEAD FINDINGS Brain: Cerebral volume within normal limits. No focal parenchymal signal abnormality. No abnormal foci of restricted diffusion to suggest acute or subacute ischemia. No areas of chronic cortical infarction. No acute or chronic intracranial blood products. No mass lesion, midline shift or mass effect. Previously question small meningioma at the left middle cranial fossa likely reflected a small cortical vein. Ventricles normal size without hydrocephalus. No extra-axial fluid collection. Pituitary gland and suprasellar region within normal limits. No abnormal enhancement. Vascular: Major intracranial vascular flow voids are maintained. Skull and upper cervical spine: Craniocervical junction within normal limits. Bone marrow signal intensity within normal limits. No scalp soft tissue abnormality. Other: Mastoid air cells are largely clear. MRI ORBITS FINDINGS Orbits: Globes are symmetric in size with normal appearance and morphology bilaterally. Optic nerves symmetric and within normal limits. No abnormal optic nerve edema or enhancement to suggest acute optic neuritis. No abnormality about either optic nerve sheath complex. Intraconal and extraconal fat maintained. Extra-ocular muscles symmetric and within normal limits. Lacrimal glands  normal. No abnormality about the orbital apices or cavernous sinus. Optic chiasm normally situated within the suprasellar cistern. Visualized sinuses: Minimal mucosal thickening about the ethmoidal air cells and right maxillary sinus. Otherwise clear. Soft tissues: Unremarkable. IMPRESSION: Normal MRI of the brain and orbits. No acute intracranial abnormality. No evidence for acute optic neuritis. Electronically Signed   By: Morene Hoard M.D.   On: 09/27/2023 21:15   CT ANGIO HEAD NECK W WO CM Result Date: 09/27/2023 EXAM: CT HEAD WITHOUT CTA HEAD AND NECK WITH AND WITHOUT 09/27/2023 05:59:40 PM TECHNIQUE: CTA of the head and neck was performed with and without the administration of intravenous contrast. Noncontrast CT of the head with reconstructed 2-D images are also provided for review. Multiplanar 2D and/or 3D reformatted images are provided for review. Automated exposure control, iterative reconstruction, and/or weight based adjustment of the mA/kV was utilized to reduce the radiation dose to as low as reasonably achievable. COMPARISON: CT head without contrast 05/26/2015 CLINICAL HISTORY: Neuro deficit, acute, stroke suspected; left numbness. Chief complaints; Jaw Pain. FINDINGS: CT HEAD: BRAIN AND VENTRICLES: No acute intracranial hemorrhage. No mass effect or midline shift. No extra-axial fluid collection. Gray-white differentiation is maintained. No hydrocephalus. A 5 x 2 mm focus of dural enhancement in the left middle cranial fossa likely represents a small meningioma. ORBITS: No acute abnormality. SINUSES: No acute abnormality. SOFT TISSUES AND SKULL: The benign exostosis in the high left parietal calvarium is stable. CTA NECK: AORTIC ARCH AND ARCH VESSELS: No dissection or arterial injury. No significant stenosis of the brachiocephalic or subclavian arteries. CERVICAL CAROTID ARTERIES: No dissection, arterial injury, or hemodynamically significant stenosis by NASCET criteria. CERVICAL  VERTEBRAL ARTERIES: No dissection, arterial injury, or significant stenosis. VISUALIZED LUNGS AND MEDIASTINUM: Unremarkable. SOFT TISSUES: No acute abnormality. BONES: No acute  abnormality. CTA HEAD: ANTERIOR CIRCULATION: No significant stenosis of the internal carotid arteries. No significant stenosis of the anterior cerebral arteries. No significant stenosis of the middle cerebral arteries. No aneurysm. POSTERIOR CIRCULATION: No significant stenosis of the posterior cerebral arteries. No significant stenosis of the basilar artery. No significant stenosis of the vertebral arteries. No aneurysm. OTHER: No dural venous sinus thrombosis on this non-dedicated study. IMPRESSION: 1. No acute intracranial hemorrhage. 2. No large vessel occlusion, hemodynamically significant stenosis, or aneurysm in the head or neck. 3. 5 x 2 mm focus of dural enhancement in the left middle cranial fossa, likely representing a small meningioma. Electronically signed by: Lonni Necessary MD 09/27/2023 06:18 PM EDT RP Workstation: [id number]   DG Chest 2 View Result Date: 09/27/2023 CLINICAL DATA:  Left arm numbness. EXAM: CHEST - 2 VIEW COMPARISON:  None Available. FINDINGS: The heart size and mediastinal contours are within normal limits. Both lungs are clear. The visualized skeletal structures are unremarkable. IMPRESSION: No active cardiopulmonary disease. Electronically Signed   By: Vanetta Chou M.D.   On: 09/27/2023 16:17     Procedures   Medications Ordered in the ED  diphenhydrAMINE  (BENADRYL ) injection 12.5 mg (12.5 mg Intravenous Given 09/27/23 1718)  prochlorperazine  (COMPAZINE ) injection 10 mg (10 mg Intravenous Given 09/27/23 1719)  dexamethasone  (DECADRON ) injection 10 mg (10 mg Intravenous Given 09/27/23 1722)  sodium chloride  0.9 % bolus 1,000 mL (0 mLs Intravenous Stopped 09/27/23 1903)  iohexol  (OMNIPAQUE ) 350 MG/ML injection 75 mL (75 mLs Intravenous Contrast Given 09/27/23 1804)  gadobutrol  (GADAVIST ) 1  MMOL/ML injection 9 mL (9 mLs Intravenous Contrast Given 09/27/23 3912)   43 year old here for evaluation of left-sided decree sensation.  She has recurrent chronic headache.  No sudden onset thunderclap headache.  Also noted some pressure into her left jaw region chest pain, shortness of breath.  She has a nonfocal neuroexam without deficits.  Some associated tinnitus to her left ear.  No evidence of VTE on exam.  Will plan on broad workup, treat with migraine cocktail in case of complicated migraine and reassess  Labs and imaging personally viewed interpreted Labs without significant abnormality, D-dimer within normal limits, delta troponin flat CTA head and neck with possible meningioma MRI brain, orbits, cervical spine with and without contrast without significant abnormality  Patient reassessed.  Symptoms improved after migraine cocktail.  Pending MRI  MRI without significant abnormality.  Discussed results with patient and family at bedside.  Question complicated migraine.  At this time I low suspicion for acute ACS, PE, dissection, unstable angina, GCA, temporal arteritis, trigeminal neuralgia, infectious process, CVA, bleed, IIH, optic neuritis, MS, ischemia, VTE, electrolyte abnormality, toxidrome, meningitis.  Will have her follow-up outpatient, return for new or worsening symptoms.  The patient has been appropriately medically screened and/or stabilized in the ED. I have low suspicion for any other emergent medical condition which would require further screening, evaluation or treatment in the ED or require inpatient management.  Patient is hemodynamically stable and in no acute distress.  Patient able to ambulate in department prior to ED.  Evaluation does not show acute pathology that would require ongoing or additional emergent interventions while in the emergency department or further inpatient treatment.  I have discussed the diagnosis with the patient and answered all questions.   Pain is been managed while in the emergency department and patient has no further complaints prior to discharge.  Patient is comfortable with plan discussed in room and is stable for discharge at this time.  I have discussed strict return precautions for returning to the emergency department.  Patient was encouraged to follow-up with PCP/specialist refer to at discharge.                                   Medical Decision Making Amount and/or Complexity of Data Reviewed Independent Historian: spouse External Data Reviewed: labs, radiology, ECG and notes. Labs: ordered. Decision-making details documented in ED Course. Radiology: ordered and independent interpretation performed. Decision-making details documented in ED Course. ECG/medicine tests: ordered and independent interpretation performed. Decision-making details documented in ED Course.  Risk OTC drugs. Prescription drug management. Parenteral controlled substances. Decision regarding hospitalization. Diagnosis or treatment significantly limited by social determinants of health.        Final diagnoses:  Paresthesia  Acute nonintractable headache, unspecified headache type    ED Discharge Orders     None          [place] Dodgen A, PA-C 09/28/23 0021    Elnor Bernarda SQUIBB, DO 10/06/23 [id number]

## 2023-09-27 NOTE — ED Notes (Signed)
 Patient transported to CT

## 2023-09-27 NOTE — Discharge Instructions (Signed)
 It was a pleasure taking care of you here today.  Your workup today was reassuring.  Keep a close monitor of your symptoms.  If you have any new or worsening symptoms please seek reevaluation in the emergency department otherwise follow-up with primary care provider

## 2024-03-09 ENCOUNTER — Ambulatory Visit: Admitting: Neurology
